# Patient Record
Sex: Female | Born: 2000 | Race: Black or African American | Hispanic: No | Marital: Single | Smoking: Never smoker
Health system: Southern US, Community
[De-identification: ages and names within clinical notes are randomized; demographics above are authoritative.]

## PROBLEM LIST (undated history)

## (undated) ENCOUNTER — Ambulatory Visit (HOSPITAL_COMMUNITY): Admission: EM | Payer: BC Managed Care – PPO

## (undated) DIAGNOSIS — E559 Vitamin D deficiency, unspecified: Secondary | ICD-10-CM

## (undated) DIAGNOSIS — J45990 Exercise induced bronchospasm: Secondary | ICD-10-CM

## (undated) HISTORY — DX: Vitamin D deficiency, unspecified: E55.9

## (undated) HISTORY — DX: Exercise induced bronchospasm: J45.990

## (undated) HISTORY — PX: NO PAST SURGERIES: SHX2092

---
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
## 2019-02-02 ENCOUNTER — Ambulatory Visit: Payer: BC Managed Care – PPO | Attending: Internal Medicine

## 2019-02-02 DIAGNOSIS — Z20822 Contact with and (suspected) exposure to covid-19: Secondary | ICD-10-CM

## 2019-02-05 LAB — NOVEL CORONAVIRUS, NAA: SARS-CoV-2, NAA: DETECTED — AB

## 2019-02-15 ENCOUNTER — Ambulatory Visit: Payer: BC Managed Care – PPO | Attending: Internal Medicine

## 2019-02-15 DIAGNOSIS — Z20822 Contact with and (suspected) exposure to covid-19: Secondary | ICD-10-CM

## 2019-02-16 LAB — NOVEL CORONAVIRUS, NAA: SARS-CoV-2, NAA: NOT DETECTED

## 2019-07-25 ENCOUNTER — Telehealth: Payer: Self-pay | Admitting: Family Medicine

## 2019-07-25 NOTE — Telephone Encounter (Signed)
Ok to put in any 30 min time slot available.

## 2019-07-25 NOTE — Telephone Encounter (Signed)
The patient is needing to reschedule her physical. The next available physical that I seen is in October.   Can we work this patient in sooner for her physical? She needs a Wednesday or Friday between 8-9:30  Please advise

## 2019-07-26 NOTE — Telephone Encounter (Signed)
Spoke with the pt and scheduled an appt for 9/1.

## 2019-07-28 ENCOUNTER — Encounter: Payer: Self-pay | Admitting: Family Medicine

## 2019-09-05 ENCOUNTER — Telehealth: Payer: Self-pay | Admitting: Family Medicine

## 2019-09-05 NOTE — Telephone Encounter (Signed)
Pt is calling to see if she can get her labs for her CPE done before the September 27, 2019 appointment.

## 2019-09-06 ENCOUNTER — Other Ambulatory Visit: Payer: Self-pay | Admitting: Family Medicine

## 2019-09-06 DIAGNOSIS — Z131 Encounter for screening for diabetes mellitus: Secondary | ICD-10-CM

## 2019-09-06 DIAGNOSIS — D72810 Lymphocytopenia: Secondary | ICD-10-CM

## 2019-09-06 DIAGNOSIS — E559 Vitamin D deficiency, unspecified: Secondary | ICD-10-CM

## 2019-09-06 DIAGNOSIS — Z13220 Encounter for screening for lipoid disorders: Secondary | ICD-10-CM

## 2019-09-06 NOTE — Telephone Encounter (Signed)
Left a detailed message at the pts cell number to call back for a fasting lab appt as below. 

## 2019-09-06 NOTE — Telephone Encounter (Signed)
Labs ordered.  Please help set up lab visit appointment.

## 2019-09-08 ENCOUNTER — Telehealth: Payer: Self-pay | Admitting: Family Medicine

## 2019-09-08 NOTE — Telephone Encounter (Signed)
Pt is coming in 08/16 for labs. She is requesting her iron be checked at the visit.  Please advise

## 2019-09-11 ENCOUNTER — Other Ambulatory Visit (INDEPENDENT_AMBULATORY_CARE_PROVIDER_SITE_OTHER): Payer: Self-pay

## 2019-09-11 ENCOUNTER — Other Ambulatory Visit: Payer: Self-pay | Admitting: Family Medicine

## 2019-09-11 ENCOUNTER — Other Ambulatory Visit: Payer: Self-pay

## 2019-09-11 DIAGNOSIS — Z13220 Encounter for screening for lipoid disorders: Secondary | ICD-10-CM

## 2019-09-11 DIAGNOSIS — Z131 Encounter for screening for diabetes mellitus: Secondary | ICD-10-CM

## 2019-09-11 DIAGNOSIS — R5383 Other fatigue: Secondary | ICD-10-CM

## 2019-09-11 DIAGNOSIS — E559 Vitamin D deficiency, unspecified: Secondary | ICD-10-CM

## 2019-09-11 DIAGNOSIS — D72810 Lymphocytopenia: Secondary | ICD-10-CM

## 2019-09-11 LAB — COMPREHENSIVE METABOLIC PANEL
AG Ratio: 1.8 (calc) (ref 1.0–2.5)
ALT: 9 U/L (ref 6–29)
AST: 17 U/L (ref 10–30)
Albumin: 4.6 g/dL (ref 3.6–5.1)
Alkaline phosphatase (APISO): 48 U/L (ref 31–125)
BUN: 19 mg/dL (ref 7–25)
CO2: 25 mmol/L (ref 20–32)
Calcium: 9.3 mg/dL (ref 8.6–10.2)
Chloride: 104 mmol/L (ref 98–110)
Creat: 0.79 mg/dL (ref 0.50–1.10)
Globulin: 2.6 g/dL (calc) (ref 1.9–3.7)
Glucose, Bld: 73 mg/dL (ref 65–99)
Potassium: 3.8 mmol/L (ref 3.5–5.3)
Sodium: 137 mmol/L (ref 135–146)
Total Bilirubin: 0.7 mg/dL (ref 0.2–1.2)
Total Protein: 7.2 g/dL (ref 6.1–8.1)

## 2019-09-11 LAB — CBC WITH DIFFERENTIAL/PLATELET
Absolute Monocytes: 292 cells/uL (ref 200–950)
Basophils Absolute: 20 cells/uL (ref 0–200)
Basophils Relative: 0.6 %
Eosinophils Absolute: 102 cells/uL (ref 15–500)
Eosinophils Relative: 3 %
HCT: 38.1 % (ref 35.0–45.0)
Hemoglobin: 12.9 g/dL (ref 11.7–15.5)
Lymphs Abs: 1125 cells/uL (ref 850–3900)
MCH: 30.4 pg (ref 27.0–33.0)
MCHC: 33.9 g/dL (ref 32.0–36.0)
MCV: 89.9 fL (ref 80.0–100.0)
MPV: 11.7 fL (ref 7.5–12.5)
Monocytes Relative: 8.6 %
Neutro Abs: 1860 cells/uL (ref 1500–7800)
Neutrophils Relative %: 54.7 %
Platelets: 167 10*3/uL (ref 140–400)
RBC: 4.24 10*6/uL (ref 3.80–5.10)
RDW: 12 % (ref 11.0–15.0)
Total Lymphocyte: 33.1 %
WBC: 3.4 10*3/uL — ABNORMAL LOW (ref 3.8–10.8)

## 2019-09-11 LAB — IRON,TIBC AND FERRITIN PANEL
%SAT: 25 % (calc) (ref 16–45)
Ferritin: 38 ng/mL (ref 16–154)
Iron: 95 ug/dL (ref 40–190)
TIBC: 373 mcg/dL (calc) (ref 250–450)

## 2019-09-11 LAB — LIPID PANEL
Cholesterol: 126 mg/dL (ref <200)
HDL: 55 mg/dL (ref >=50)
LDL Cholesterol (Calc): 63 mg/dL (calc)
Non-HDL Cholesterol (Calc): 71 mg/dL (calc) (ref <130)
Total CHOL/HDL Ratio: 2.3 (calc) (ref <5.0)
Triglycerides: 27 mg/dL (ref <150)

## 2019-09-11 LAB — VITAMIN D 25 HYDROXY (VIT D DEFICIENCY, FRACTURES): Vit D, 25-Hydroxy: 27 ng/mL — ABNORMAL LOW (ref 30–100)

## 2019-09-11 NOTE — Telephone Encounter (Signed)
Iron panel added to bloodwork already ordered. Please make sure lab aware of orders from two different days (8/11 and 8/16) or that patient makes her aware.

## 2019-09-11 NOTE — Telephone Encounter (Signed)
Left a detailed message at the pts cell number to let the lab tech know there are labs to be done from 2 separate dates.

## 2019-09-27 ENCOUNTER — Ambulatory Visit: Payer: Self-pay | Admitting: Family Medicine

## 2019-11-20 ENCOUNTER — Telehealth: Payer: Self-pay | Admitting: *Deleted

## 2019-11-20 NOTE — Telephone Encounter (Signed)
Left a detailed message at the pts cell number to return a call with the specific name of the "vaccine" the patient is referring to.

## 2019-11-20 NOTE — Telephone Encounter (Signed)
Patient called wanting to know if she needs her physical before getting the vaccine. Patient is scheduled for a CPE 12/20/19. I made patient aware. I asked patient if she would like to schedule her vaccine and patient requested a call back from the nurse regarding this first.

## 2019-11-21 NOTE — Telephone Encounter (Signed)
Patient called back and stated she saw a sign that stated you should check with your healthcare provider before receiving the Covid vaccine.  I advised the pt we do not require a visit prior to receiving a Covid vaccine as it is up the pt to receive the vaccine or not.

## 2019-12-20 ENCOUNTER — Other Ambulatory Visit: Payer: Self-pay

## 2019-12-20 ENCOUNTER — Ambulatory Visit (INDEPENDENT_AMBULATORY_CARE_PROVIDER_SITE_OTHER): Payer: BC Managed Care – PPO | Admitting: Family Medicine

## 2019-12-20 ENCOUNTER — Encounter: Payer: Self-pay | Admitting: Family Medicine

## 2019-12-20 VITALS — BP 92/50 | HR 96 | Temp 98.1°F | Ht 63.5 in | Wt 133.4 lb

## 2019-12-20 DIAGNOSIS — Z0000 Encounter for general adult medical examination without abnormal findings: Secondary | ICD-10-CM

## 2019-12-20 NOTE — Progress Notes (Signed)
Dauna Awurabena Delali Som-Pimpong DOB: 2000/07/24 Encounter date: 12/20/2019  This is a 19 y.o. female who presents for complete physical   History of present illness/Additional concerns: Last visit was over a year ago.  At that time we had been regularly discussing some stress and anxiety.  She was focusing on school at that time.  Things are going well; grades are good, more at peace. She is doing nursing as well as premed requirements.   Sleeps well.   Exercises on fairly regular basis. Eating healthy.    Past Medical History:  Diagnosis Date  . Exercise-induced asthma    Past Surgical History:  Procedure Laterality Date  . NO PAST SURGERIES     Allergies  Allergen Reactions  . Almond (Diagnostic)   . Apple   . Other Swelling    Jackfruit- lip swelling; dogs-hives     Current Meds  Medication Sig  . Multiple Vitamins-Minerals (MULTIVITAMIN ADULT PO) Take by mouth.  Marland Kitchen VITAMIN D, CHOLECALCIFEROL, PO Take by mouth daily.   Social History   Tobacco Use  . Smoking status: Never Smoker  . Smokeless tobacco: Never Used  Substance Use Topics  . Alcohol use: Never   Family History  Problem Relation Age of Onset  . Diabetes Mother   . Hypertension Mother   . Diabetes Father   . Alzheimer's disease Maternal Grandmother 90  . Edema Paternal Grandmother   . Dementia Maternal Aunt 80  . Dementia Maternal Aunt 70     Review of Systems  Constitutional: Negative for activity change, appetite change, chills, fatigue, fever and unexpected weight change.  HENT: Negative for congestion, ear pain, hearing loss, sinus pressure, sinus pain, sore throat and trouble swallowing.   Eyes: Negative for pain and visual disturbance.  Respiratory: Negative for cough, chest tightness, shortness of breath and wheezing.   Cardiovascular: Negative for chest pain, palpitations and leg swelling.  Gastrointestinal: Negative for abdominal pain, blood in stool, constipation, diarrhea, nausea  and vomiting.  Genitourinary: Negative for difficulty urinating and menstrual problem.  Musculoskeletal: Negative for arthralgias and back pain.  Skin: Negative for rash.  Neurological: Negative for dizziness, weakness, numbness and headaches.  Hematological: Negative for adenopathy. Does not bruise/bleed easily.  Psychiatric/Behavioral: Negative for sleep disturbance and suicidal ideas. The patient is not nervous/anxious.     CBC:  Lab Results  Component Value Date   WBC 3.4 (L) 09/11/2019   HGB 12.9 09/11/2019   HCT 38.1 09/11/2019   MCH 30.4 09/11/2019   MCHC 33.9 09/11/2019   RDW 12.0 09/11/2019   PLT 167 09/11/2019   MPV 11.7 09/11/2019   CMP: Lab Results  Component Value Date   NA 137 09/11/2019   K 3.8 09/11/2019   CL 104 09/11/2019   CO2 25 09/11/2019   GLUCOSE 73 09/11/2019   BUN 19 09/11/2019   CREATININE 0.79 09/11/2019   CALCIUM 9.3 09/11/2019   PROT 7.2 09/11/2019   BILITOT 0.7 09/11/2019   ALKPHOS 50 08/22/2018   ALT 9 09/11/2019   AST 17 09/11/2019   LIPID: Lab Results  Component Value Date   CHOL 126 09/11/2019   TRIG 27 09/11/2019   HDL 55 09/11/2019   LDLCALC 63 09/11/2019    Objective:  BP (!) 92/50 (BP Location: Right Arm, Patient Position: Sitting, Cuff Size: Normal)   Pulse 96   Temp 98.1 F (36.7 C) (Oral)   Ht 5' 3.5" (1.613 m)   Wt 133 lb 6.4 oz (60.5 kg)   BMI  23.26 kg/m   Weight: 133 lb 6.4 oz (60.5 kg)   BP Readings from Last 3 Encounters:  12/20/19 (!) 92/50  09/14/18 97/67  08/22/18 (!) 90/50   Wt Readings from Last 3 Encounters:  12/20/19 133 lb 6.4 oz (60.5 kg)  10/17/18 126 lb (57.2 kg) (47 %, Z= -0.07)*  09/14/18 120 lb 12.8 oz (54.8 kg) (37 %, Z= -0.33)*   * Growth percentiles are based on CDC (Girls, 2-20 Years) data.    Physical Exam Constitutional:      General: She is not in acute distress.    Appearance: She is well-developed.  HENT:     Head: Normocephalic and atraumatic.     Right Ear: External ear  normal.     Left Ear: External ear normal.     Mouth/Throat:     Pharynx: No oropharyngeal exudate.  Eyes:     Conjunctiva/sclera: Conjunctivae normal.     Pupils: Pupils are equal, round, and reactive to light.  Neck:     Thyroid: No thyromegaly.  Cardiovascular:     Rate and Rhythm: Normal rate and regular rhythm.     Heart sounds: Normal heart sounds. No murmur heard.  No friction rub. No gallop.   Pulmonary:     Effort: Pulmonary effort is normal.     Breath sounds: Normal breath sounds.  Abdominal:     General: Bowel sounds are normal. There is no distension.     Palpations: Abdomen is soft. There is no mass.     Tenderness: There is no abdominal tenderness. There is no guarding.     Hernia: No hernia is present.  Musculoskeletal:        General: No tenderness or deformity. Normal range of motion.     Cervical back: Normal range of motion and neck supple.  Lymphadenopathy:     Cervical: No cervical adenopathy.  Skin:    General: Skin is warm and dry.     Findings: No rash.  Neurological:     Mental Status: She is alert and oriented to person, place, and time.     Deep Tendon Reflexes: Reflexes normal.     Reflex Scores:      Tricep reflexes are 2+ on the right side and 2+ on the left side.      Bicep reflexes are 2+ on the right side and 2+ on the left side.      Brachioradialis reflexes are 2+ on the right side and 2+ on the left side.      Patellar reflexes are 2+ on the right side and 2+ on the left side. Psychiatric:        Speech: Speech normal.        Behavior: Behavior normal.        Thought Content: Thought content normal.     Assessment/Plan: Health Maintenance Due  Topic Date Due  . Hepatitis C Screening  Never done   Health Maintenance reviewed.  1. Preventative health care Delali is doing well. She is managing stress well. She is doing well in school. We discussed keeping up with healthy lifestyle behaviors like exercise to help with stress  management.   Return in about 1 year (around 12/19/2020) for physical exam.  Theodis Shove, MD

## 2020-05-31 ENCOUNTER — Other Ambulatory Visit: Payer: Self-pay

## 2020-06-03 ENCOUNTER — Ambulatory Visit (INDEPENDENT_AMBULATORY_CARE_PROVIDER_SITE_OTHER): Payer: Self-pay | Admitting: Family Medicine

## 2020-06-03 ENCOUNTER — Encounter: Payer: Self-pay | Admitting: Family Medicine

## 2020-06-03 ENCOUNTER — Other Ambulatory Visit: Payer: Self-pay

## 2020-06-03 VITALS — BP 120/50 | HR 60 | Temp 98.1°F | Ht 63.5 in | Wt 128.7 lb

## 2020-06-03 DIAGNOSIS — K5900 Constipation, unspecified: Secondary | ICD-10-CM

## 2020-06-03 DIAGNOSIS — D72819 Decreased white blood cell count, unspecified: Secondary | ICD-10-CM | POA: Diagnosis not present

## 2020-06-03 DIAGNOSIS — E559 Vitamin D deficiency, unspecified: Secondary | ICD-10-CM

## 2020-06-03 DIAGNOSIS — N920 Excessive and frequent menstruation with regular cycle: Secondary | ICD-10-CM

## 2020-06-03 DIAGNOSIS — E611 Iron deficiency: Secondary | ICD-10-CM | POA: Diagnosis not present

## 2020-06-03 DIAGNOSIS — Z0000 Encounter for general adult medical examination without abnormal findings: Secondary | ICD-10-CM

## 2020-06-03 LAB — FERRITIN: Ferritin: 31.4 ng/mL (ref 10.0–291.0)

## 2020-06-03 LAB — TSH: TSH: 1.09 u[IU]/mL (ref 0.35–4.50)

## 2020-06-03 NOTE — Patient Instructions (Addendum)
*  constipation - work on regular water intake. I think with regular prune juice. Fiber supplementation is an option - benefiber or metamucil or citrucel*. Stool softeners are also ok - colace. Finally, miralax is an option. You could consider using something like a miralax to pull in water and help with regular bowel movements. AVOID laxatives.   *if you want something for nausea with periods let me know. Work on getting ibuprofen 72 hours prior to your period.

## 2020-06-03 NOTE — Progress Notes (Signed)
Roselynne Awurabena Delali Som-Pimpong DOB: May 25, 2000 Encounter date: 06/03/2020  This is a 20 y.o. female who presents for complete physical   History of present illness/Additional concerns: She is about to start nursing school. School at [employer]. Has papers she needs completed.   She is having difficulty with bowel movements. When good - will have BM every morning. When not good then she will not go for a couple of days. Worse when she isn't good with water intake. Prune juice helps with regularity if needed. Has to strain or sit on toilet for a long time to have bm.   Periods also a concern - LMP was Friday. First and second day are really bad - throws up for those days. She is working on cutting out sugar, working on exercise as well to see if it will help. She has always had bad periods. Hasn't been on birth control before; doesn't want to bo on it. Regular every 4 weeks. Gets bad cramping and tries to take it before period. Mother had similarly bad periods. Hot drinks, exercise seems to help. Bowel movement also seems to help with cramping.   Has been controlling anxiety well with meditation, prayer. Feels more aware of what she is feeling and how to manage this.    Past Medical History:  Diagnosis Date  . Exercise-induced asthma    Past Surgical History:  Procedure Laterality Date  . NO PAST SURGERIES     Allergies  Allergen Reactions  . Almond (Diagnostic)   . Apple   . Other Swelling    Jackfruit- lip swelling; dogs-hives     No outpatient medications have been marked as taking for the 06/03/20 encounter (Office Visit) with Wynn Banker, MD.   Social History   Tobacco Use  . Smoking status: Never Smoker  . Smokeless tobacco: Never Used  Substance Use Topics  . Alcohol use: Never   Family History  Problem Relation Age of Onset  . Diabetes Mother   . Hypertension Mother   . Diabetes Father   . Alzheimer's disease Maternal Grandmother 90  . Edema Paternal Grandmother    . Dementia Maternal Aunt 80  . Dementia Maternal Aunt 70     Review of Systems  Constitutional: Negative for activity change, appetite change, chills, fatigue, fever and unexpected weight change.  HENT: Negative for congestion, ear pain, hearing loss, sinus pressure, sinus pain, sore throat and trouble swallowing.   Eyes: Negative for pain and visual disturbance.  Respiratory: Negative for cough, chest tightness, shortness of breath and wheezing.   Cardiovascular: Negative for chest pain, palpitations and leg swelling.  Gastrointestinal: Negative for abdominal pain, blood in stool, constipation, diarrhea, nausea and vomiting.  Genitourinary: Positive for menstrual problem. Negative for difficulty urinating.  Musculoskeletal: Negative for arthralgias and back pain.  Skin: Negative for rash.  Neurological: Negative for dizziness, weakness, numbness and headaches.  Hematological: Negative for adenopathy. Does not bruise/bleed easily.  Psychiatric/Behavioral: Negative for sleep disturbance and suicidal ideas. The patient is not nervous/anxious.     CBC:  Lab Results  Component Value Date   WBC 3.4 (L) 09/11/2019   HGB 12.9 09/11/2019   HCT 38.1 09/11/2019   MCH 30.4 09/11/2019   MCHC 33.9 09/11/2019   RDW 12.0 09/11/2019   PLT 167 09/11/2019   MPV 11.7 09/11/2019   CMP: Lab Results  Component Value Date   NA 137 09/11/2019   K 3.8 09/11/2019   CL 104 09/11/2019   CO2 25 09/11/2019  GLUCOSE 73 09/11/2019   BUN 19 09/11/2019   CREATININE 0.79 09/11/2019   CALCIUM 9.3 09/11/2019   PROT 7.2 09/11/2019   BILITOT 0.7 09/11/2019   ALKPHOS 50 08/22/2018   ALT 9 09/11/2019   AST 17 09/11/2019   LIPID: Lab Results  Component Value Date   CHOL 126 09/11/2019   TRIG 27 09/11/2019   HDL 55 09/11/2019   LDLCALC 63 09/11/2019    Objective:  BP (!) 120/50 (BP Location: Right Arm, Patient Position: Sitting, Cuff Size: Normal)   Pulse 60   Temp 98.1 F (36.7 C) (Oral)    Ht 5' 3.5" (1.613 m)   Wt 128 lb 11.2 oz (58.4 kg)   LMP 05/31/2020 (Exact Date)   SpO2 99%   BMI 22.44 kg/m   Weight: 128 lb 11.2 oz (58.4 kg)   BP Readings from Last 3 Encounters:  06/03/20 (!) 120/50  12/20/19 (!) 92/50  09/14/18 97/67   Wt Readings from Last 3 Encounters:  06/03/20 128 lb 11.2 oz (58.4 kg)  12/20/19 133 lb 6.4 oz (60.5 kg)  10/17/18 126 lb (57.2 kg) (47 %, Z= -0.07)*   * Growth percentiles are based on CDC (Girls, 2-20 Years) data.    Physical Exam Constitutional:      General: She is not in acute distress.    Appearance: She is well-developed.  HENT:     Head: Normocephalic and atraumatic.     Right Ear: External ear normal.     Left Ear: External ear normal.     Mouth/Throat:     Pharynx: No oropharyngeal exudate.  Eyes:     Conjunctiva/sclera: Conjunctivae normal.     Pupils: Pupils are equal, round, and reactive to light.  Neck:     Thyroid: No thyromegaly.  Cardiovascular:     Rate and Rhythm: Normal rate and regular rhythm.     Heart sounds: Normal heart sounds. No murmur heard. No friction rub. No gallop.   Pulmonary:     Effort: Pulmonary effort is normal.     Breath sounds: Normal breath sounds.  Abdominal:     General: Bowel sounds are normal. There is no distension.     Palpations: Abdomen is soft. There is no mass.     Tenderness: There is no abdominal tenderness. There is no guarding.     Hernia: No hernia is present.  Musculoskeletal:        General: No tenderness or deformity. Normal range of motion.     Cervical back: Normal range of motion and neck supple.  Lymphadenopathy:     Cervical: No cervical adenopathy.  Skin:    General: Skin is warm and dry.     Findings: No rash.  Neurological:     Mental Status: She is alert and oriented to person, place, and time.     Deep Tendon Reflexes: Reflexes normal.     Reflex Scores:      Tricep reflexes are 2+ on the right side and 2+ on the left side.      Bicep reflexes are 2+ on  the right side and 2+ on the left side.      Brachioradialis reflexes are 2+ on the right side and 2+ on the left side.      Patellar reflexes are 2+ on the right side and 2+ on the left side. Psychiatric:        Speech: Speech normal.        Behavior: Behavior normal.  Thought Content: Thought content normal.     Assessment/Plan: Health Maintenance Due  Topic Date Due  . Hepatitis C Screening  Never done  . PAP-Cervical Cytology Screening  04/28/2020  . PAP SMEAR-Modifier  04/28/2020   Health Maintenance reviewed.  1. Preventative health care We discussed keeping up with stress prevention including having regular exercise routine.  She has been good about being more in tune with her stress levels and managing this stress overall.  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 - QuantiFERON-TB Gold Plus; Future  2. Menorrhagia with regular cycle She does not wish to be on any birth control.  She mostly worries about side effects or future issues with fertility.  We discussed different period control options including contraceptive pills that may be helpful for lightening and shortening her.  As well as decreasing cramps.  She would like to try to work on exercise and continue with ibuprofen for now.  We discussed premedicating (which she has been trying to do) to help decrease cramps.  I would be okay with giving her some Zofran to use during that day of cramping so that she is able to keep down ibuprofen to help with discomfort.  She does not wish to try this at present though. - TSH; Future  3. Vitamin D deficiency She was taking supplement regularly.  Levels were normal on last check.  4. Leukopenia, unspecified type Checking blood work today.  5. Iron deficiency - Ferritin; Future  6. Constipation, unspecified constipation type She is going to work on water intake.    Return in about 1 year (around 06/03/2021) for physical exam.  Theodis Shove,  MD

## 2020-06-05 LAB — QUANTIFERON-TB GOLD PLUS
Mitogen-NIL: >10 IU/mL
NIL: 0.02 IU/mL
QuantiFERON-TB Gold Plus: NEGATIVE
TB1-NIL: 0 IU/mL
TB2-NIL: 0 IU/mL

## 2020-06-26 ENCOUNTER — Telehealth: Payer: Self-pay | Admitting: Family Medicine

## 2020-06-26 NOTE — Telephone Encounter (Signed)
Spoke with the pt for more information as to what she was referring to as below.  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

## 2020-06-26 NOTE — Telephone Encounter (Signed)
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  Pt would like to have a call back to let her know.

## 2021-07-30 ENCOUNTER — Other Ambulatory Visit: Payer: Self-pay | Admitting: Family

## 2021-07-30 ENCOUNTER — Telehealth: Payer: Self-pay | Admitting: Family Medicine

## 2021-07-30 NOTE — Telephone Encounter (Signed)
Pt requesting a referral to a gastroenterologist for constipation

## 2021-07-31 ENCOUNTER — Other Ambulatory Visit: Payer: Self-pay | Admitting: Family

## 2021-07-31 DIAGNOSIS — K5900 Constipation, unspecified: Secondary | ICD-10-CM

## 2021-07-31 MED ORDER — POLYETHYLENE GLYCOL 3350 17 GM/SCOOP PO POWD: 17.0000 g | Freq: Two times a day (BID) | ORAL | 1 refills | Status: DC | PRN

## 2021-07-31 NOTE — Telephone Encounter (Signed)
Spoke with the patient and she stated she has tried prune juice, magnesium and smoothie which helped some.  Patient states she mentioned this at her last visit over 1 yr ago, often has to sit on the toliet for 2 hours, has had constipation for over 1 year and would like to know why this keeps happening to her?  Message sent to [facility].

## 2021-08-01 NOTE — Telephone Encounter (Signed)
Patient informed of the message below.

## 2021-08-08 ENCOUNTER — Ambulatory Visit: Payer: BC Managed Care – PPO | Admitting: Family

## 2021-08-08 ENCOUNTER — Encounter: Payer: Self-pay | Admitting: Family

## 2021-08-08 VITALS — BP 80/50 | HR 66 | Temp 98.4°F | Ht 63.5 in | Wt 140.8 lb

## 2021-08-08 DIAGNOSIS — E559 Vitamin D deficiency, unspecified: Secondary | ICD-10-CM | POA: Diagnosis not present

## 2021-08-08 DIAGNOSIS — R5383 Other fatigue: Secondary | ICD-10-CM | POA: Diagnosis not present

## 2021-08-08 DIAGNOSIS — F439 Reaction to severe stress, unspecified: Secondary | ICD-10-CM

## 2021-08-08 DIAGNOSIS — L309 Dermatitis, unspecified: Secondary | ICD-10-CM | POA: Diagnosis not present

## 2021-08-08 DIAGNOSIS — F430 Acute stress reaction: Secondary | ICD-10-CM | POA: Diagnosis not present

## 2021-08-08 LAB — COMPREHENSIVE METABOLIC PANEL
ALT: 7 U/L (ref 0–35)
AST: 15 U/L (ref 0–37)
Albumin: 4.9 g/dL (ref 3.5–5.2)
Alkaline Phosphatase: 47 U/L (ref 39–117)
BUN: 10 mg/dL (ref 6–23)
CO2: 28 mEq/L (ref 19–32)
Calcium: 9.7 mg/dL (ref 8.4–10.5)
Chloride: 104 mEq/L (ref 96–112)
Creatinine, Ser: 0.82 mg/dL (ref 0.40–1.20)
GFR: 101.63 mL/min (ref >60.00)
Glucose, Bld: 78 mg/dL (ref 70–99)
Potassium: 4 mEq/L (ref 3.5–5.1)
Sodium: 138 mEq/L (ref 135–145)
Total Bilirubin: 0.4 mg/dL (ref 0.2–1.2)
Total Protein: 7.5 g/dL (ref 6.0–8.3)

## 2021-08-08 LAB — VITAMIN B12: Vitamin B-12: 588 pg/mL (ref 211–911)

## 2021-08-08 LAB — CBC WITH DIFFERENTIAL/PLATELET
Basophils Absolute: 0 10*3/uL (ref 0.0–0.1)
Basophils Relative: 0.7 % (ref 0.0–3.0)
Eosinophils Absolute: 0.1 10*3/uL (ref 0.0–0.7)
Eosinophils Relative: 2.3 % (ref 0.0–5.0)
HCT: 37.6 % (ref 36.0–46.0)
Hemoglobin: 12.6 g/dL (ref 12.0–15.0)
Lymphocytes Relative: 40.2 % (ref 12.0–46.0)
Lymphs Abs: 1.3 10*3/uL (ref 0.7–4.0)
MCHC: 33.5 g/dL (ref 30.0–36.0)
MCV: 88.9 fl (ref 78.0–100.0)
Monocytes Absolute: 0.3 10*3/uL (ref 0.1–1.0)
Monocytes Relative: 11.1 % (ref 3.0–12.0)
Neutro Abs: 1.4 10*3/uL (ref 1.4–7.7)
Neutrophils Relative %: 45.7 % (ref 43.0–77.0)
Platelets: 176 10*3/uL (ref 150.0–400.0)
RBC: 4.23 Mil/uL (ref 3.87–5.11)
RDW: 12.9 % (ref 11.5–15.5)
WBC: 3.1 10*3/uL — ABNORMAL LOW (ref 4.0–10.5)

## 2021-08-08 LAB — VITAMIN D 25 HYDROXY (VIT D DEFICIENCY, FRACTURES): VITD: 25.42 ng/mL — ABNORMAL LOW (ref 30.00–100.00)

## 2021-08-08 NOTE — Progress Notes (Signed)
Acute Office Visit  Subjective:     Patient ID: Lindsey Sandoval, female    DOB: 01-18-2001, 21 y.o.   MRN: [id number]  Chief Complaint  Patient presents with  . Fatigue    X2 years    HPI Patient is in today with c/o feeling fatigued for the last 2 years. She reports the symptoms coming and going. Worse during the academic year. She is a Teacher, early years/pre at [employer]. Reports feeling anxious at times and having chest flutters. She reports she goes for a run or goes to sleep and it helps. Reports having a menstrual cycle that is typically 5-7 days and heavy. Has been worked up for fatigue in the past and has been found to be vitamin d deficient but other tests normal   Review of Systems  Constitutional:  Positive for malaise/fatigue. Negative for fever.  Psychiatric/Behavioral:  Negative for depression and suicidal ideas. The patient is nervous/anxious.   All other systems reviewed and are negative.      Objective:    BP (!) 80/50 (BP Location: Left Arm, Patient Position: Sitting, Cuff Size: Normal)   Pulse 66   Temp 98.4 F (36.9 C) (Oral)   Ht 5' 3.5" (1.613 m)   Wt 140 lb 12.8 oz (63.9 kg)   LMP 07/21/2021 (Exact Date)   SpO2 98%   BMI 24.55 kg/m    Physical Exam Vitals and nursing note reviewed.  Constitutional:      Appearance: Normal appearance. She is normal weight.  HENT:     Right Ear: Tympanic membrane and ear canal normal.     Left Ear: Tympanic membrane and ear canal normal.  Cardiovascular:     Rate and Rhythm: Normal rate and regular rhythm.  Pulmonary:     Effort: Pulmonary effort is normal.     Breath sounds: Normal breath sounds.  Abdominal:     General: Abdomen is flat.     Palpations: Abdomen is soft.  Musculoskeletal:        General: Normal range of motion.     Cervical back: Normal range of motion and neck supple.  Skin:    General: Skin is warm and dry.  Neurological:     General: No focal deficit present.     Mental  Status: She is alert and oriented to person, place, and time.  Psychiatric:        Mood and Affect: Mood normal.        Behavior: Behavior normal.   No results found for any visits on 08/08/21.      Assessment & Plan:   Problem List Items Addressed This Visit   None Visit Diagnoses     Fatigue, unspecified type    -  Primary   Relevant Orders   Vitamin B12   Vitamin D, 25-hydroxy   ANA   Thyroid Panel With TSH   CBC with Differential/Platelets   CMP   Dermatitis       Relevant Orders   ANA   CMP   Stress reaction       Relevant Orders   CMP   Vitamin D deficiency       Relevant Orders   Vitamin D, 25-hydroxy   CMP      Call the office if symptoms worsen or persist. Will follow-up pending labs and sooner as needed.   No orders of the defined types were placed in this encounter.   No follow-ups on file.  Geo Slone B  Hyman Hopes, FNP

## 2021-08-10 ENCOUNTER — Other Ambulatory Visit: Payer: Self-pay | Admitting: Family

## 2021-08-10 MED ORDER — VITAMIN D (ERGOCALCIFEROL) 1.25 MG (50000 UNIT) PO CAPS: 50000.0000 [IU] | ORAL_CAPSULE | ORAL | 0 refills | Status: DC

## 2021-08-11 ENCOUNTER — Other Ambulatory Visit: Payer: Self-pay | Admitting: Family

## 2021-08-11 DIAGNOSIS — R768 Other specified abnormal immunological findings in serum: Secondary | ICD-10-CM

## 2021-08-11 LAB — THYROID PANEL WITH TSH
Free Thyroxine Index: 2.5 (ref 1.4–3.8)
T3 Uptake: 31 % (ref 22–35)
T4, Total: 8.2 ug/dL (ref 5.1–11.9)
TSH: 1.69 mIU/L

## 2021-08-11 LAB — ANTI-NUCLEAR AB-TITER (ANA TITER): ANA Titer 1: 1:80 {titer} — ABNORMAL HIGH

## 2021-08-11 LAB — ANA: Anti Nuclear Antibody (ANA): POSITIVE — AB

## 2021-08-12 ENCOUNTER — Telehealth: Payer: Self-pay | Admitting: Family Medicine

## 2021-08-12 NOTE — Telephone Encounter (Signed)
Noted  

## 2021-08-12 NOTE — Telephone Encounter (Signed)
--  Caller states she has had diarrhea since she woke up and vomited a couple of times, and one time she noticed blood in her vomit. She has urinated in the last 8 hours. She use to see Dr. Hassan Rowan but that doctor left so she is scheduled to see Dr. Kerin Ransom in August. Sx started this am. Has been seen for fatigue, ANA positive , awaiting rheumatologist.  08/12/2021 11:50:40 AM Go to ED Now Elesa Hacker, RN, Nash Dimmer  Referrals [facility] - ED  08/12/21 1416 - LVM to check on patient & see if she went to where she was disposed to go.

## 2021-08-12 NOTE — Telephone Encounter (Signed)
Pt called stating she was having terrible diarrhea x 2-3 days and just threw up blood. Pt was transferred to Triage Nurse.

## 2021-08-20 ENCOUNTER — Telehealth: Payer: Self-pay | Admitting: Family Medicine

## 2021-08-20 NOTE — Telephone Encounter (Signed)
Pt called to ask if she could have a Quantiferon test for school? She is asking if she can come in before her 08/26/21 TOC appt?  Please advise.  [phone]

## 2021-08-20 NOTE — Telephone Encounter (Signed)
Ok to order the test for her

## 2021-08-20 NOTE — Telephone Encounter (Signed)
Spoke with the patient and offered to schedule a lab appt as below.  Patient stated she would prefer to have the test done during the visit on 8/1.

## 2021-08-26 ENCOUNTER — Encounter: Payer: Self-pay | Admitting: Family Medicine

## 2021-08-26 ENCOUNTER — Ambulatory Visit: Payer: BC Managed Care – PPO | Admitting: Family Medicine

## 2021-08-26 VITALS — BP 100/58 | HR 80 | Temp 98.3°F | Ht 63.5 in | Wt 136.1 lb

## 2021-08-26 DIAGNOSIS — K5909 Other constipation: Secondary | ICD-10-CM | POA: Diagnosis not present

## 2021-08-26 DIAGNOSIS — R112 Nausea with vomiting, unspecified: Secondary | ICD-10-CM | POA: Diagnosis not present

## 2021-08-26 DIAGNOSIS — N946 Dysmenorrhea, unspecified: Secondary | ICD-10-CM | POA: Insufficient documentation

## 2021-08-26 MED ORDER — ONDANSETRON HCL 4 MG PO TABS: 4.0000 mg | ORAL_TABLET | Freq: Three times a day (TID) | ORAL | 0 refills | Status: DC | PRN

## 2021-08-26 NOTE — Progress Notes (Signed)
   Established Patient Office Visit  Subjective   Patient ID: Lindsey Sandoval, female    DOB: [birth year]  Age: 20 y.o. MRN: [id number]  Chief Complaint  Patient presents with  . Establish Care    Patient is here for follow up on her fatigue. Patient states that she used to have a lot of energy but in the last few weeks her fatigue has been getting. New symptoms of 2 episodes of vomiting and episodes of diarrhea that was just happening in the morning. She had a series of labs drawn on 7/14 and her autoimmune labs were positive so referral was made to rheumatology.she reports she was having some muscle pain in her chest which has resolved. No dizziness, no fever/chills, no sick contacts. States she was out of the country in March, went to [place].   Has a rheumatology appointment on August 8th to look at the positive ANA. Her WBC count was reviewed and is also on the low side.    {History (Optional):23778}  Review of Systems  All other systems reviewed and are negative.     Objective:     BP (!) 100/58 (BP Location: Left Arm, Patient Position: Sitting, Cuff Size: Normal)   Pulse 80   Temp 98.3 F (36.8 C) (Oral)   Ht 5' 3.5" (1.613 m)   Wt 136 lb 1.6 oz (61.7 kg)   LMP 08/17/2021 (Exact Date)   SpO2 96%   BMI 23.73 kg/m  {Vitals History (Optional):23777}  Physical Exam Vitals reviewed.     No results found for any visits on 08/26/21.  {Labs (Optional):23779}  The ASCVD Risk score (Arnett DK, et al., 2019) failed to calculate for the following reasons:   The 2019 ASCVD risk score is only valid for ages 79 to 46    Assessment & Plan:   Problem List Items Addressed This Visit       Digestive   Nausea & vomiting   Chronic constipation (Chronic)    No follow-ups on file.    Karie Georges, MD

## 2021-08-26 NOTE — Patient Instructions (Signed)
Stay as hydrated as possible.   Use the zofran as needed for nausea/ vomiting.

## 2021-08-27 NOTE — Assessment & Plan Note (Signed)
Patient reported, I encouraged her to increase hydration but since she is currently having diarrhea and not constipation then will not recommend treatment just yet - she may need additional imaging with X ray or CT abd if her symptoms persist.

## 2021-08-27 NOTE — Assessment & Plan Note (Signed)
Severe with extreme fatigue and abdominal pain during her periods, she is also due for her pap smear - -I will place referral to GYN to discuss her symptoms further and obtain pap smear.

## 2021-08-27 NOTE — Assessment & Plan Note (Addendum)
With diarrhea - persistent and not improved since her last visit 2 weeks ago - Unclear etiology-- I reviewed her labs from 2 weeks ago and she did have a positive ANA and has a rheumatology appointment pending. Will treat symptomatically for now with ondansetron 4 mg as needed for the nausea/vomiting while her workup is still pending. If no caused is revealed with the rheumatology consultation then I will order additional GI studies and possible need for consultation with GI

## 2021-09-02 ENCOUNTER — Telehealth: Payer: Self-pay | Admitting: Family Medicine

## 2021-09-02 ENCOUNTER — Other Ambulatory Visit: Payer: BC Managed Care – PPO

## 2021-09-02 DIAGNOSIS — Z111 Encounter for screening for respiratory tuberculosis: Secondary | ICD-10-CM

## 2021-09-02 NOTE — Telephone Encounter (Signed)
Spoke with the patient and scheduled a lab appt for 8/9.

## 2021-09-02 NOTE — Telephone Encounter (Signed)
Pt requesting Tb quantiferon for school. Had Tdap 06/24/2019, states school requires it yearly.

## 2021-09-02 NOTE — Telephone Encounter (Signed)
Ok to place order for patient.

## 2021-09-03 ENCOUNTER — Encounter: Payer: Self-pay | Admitting: Radiology

## 2021-09-03 ENCOUNTER — Other Ambulatory Visit (INDEPENDENT_AMBULATORY_CARE_PROVIDER_SITE_OTHER): Payer: BC Managed Care – PPO

## 2021-09-03 ENCOUNTER — Ambulatory Visit (INDEPENDENT_AMBULATORY_CARE_PROVIDER_SITE_OTHER): Payer: BC Managed Care – PPO | Admitting: Radiology

## 2021-09-03 ENCOUNTER — Other Ambulatory Visit (HOSPITAL_COMMUNITY)
Admission: RE | Admit: 2021-09-03 | Discharge: 2021-09-03 | Disposition: A | Discharge status: Home / Self Care | Payer: BC Managed Care – PPO | Source: Ambulatory Visit | Attending: Radiology | Admitting: Radiology

## 2021-09-03 VITALS — BP 88/58 | Ht 63.0 in | Wt 139.0 lb

## 2021-09-03 DIAGNOSIS — N946 Dysmenorrhea, unspecified: Secondary | ICD-10-CM | POA: Diagnosis not present

## 2021-09-03 DIAGNOSIS — Z111 Encounter for screening for respiratory tuberculosis: Secondary | ICD-10-CM

## 2021-09-03 DIAGNOSIS — Z01419 Encounter for gynecological examination (general) (routine) without abnormal findings: Secondary | ICD-10-CM | POA: Insufficient documentation

## 2021-09-03 DIAGNOSIS — N920 Excessive and frequent menstruation with regular cycle: Secondary | ICD-10-CM

## 2021-09-03 NOTE — Progress Notes (Signed)
   Nikala Ammie Ferrier Delali Som-Pimpong Jul 13, 2000 [id number]   History:  21 y.o. G0 presents for annual exam as a new patient. She has been struggling with heavy painful periods with associated nausea and diarrhea since menarche at age 34. She uses ibuprofen with some effect. For the past month she has been experiencing fatigue. Positive ANA, currently being worked up by rheumatology.   Gynecologic History Patient's last menstrual period was 08/17/2021 (exact date). Period Cycle (Days): 28 Period Duration (Days): 7 Period Pattern: Regular Menstrual Flow: Moderate Menstrual Control: Maxi pad Dysmenorrhea: (!) Severe Dysmenorrhea Symptoms: Cramping Contraception/Family planning: abstinence Sexually active: no Last Pap: never. Obstetric History OB History  Gravida Para Term Preterm AB Living  0 0 0 0 0 0  SAB IAB Ectopic Multiple Live Births  0 0 0 0 0     The following portions of the patient's history were reviewed and updated as appropriate: allergies, current medications, past family history, past medical history, past social history, past surgical history, and problem list.  Review of Systems Pertinent items noted in HPI and remainder of comprehensive ROS otherwise negative.   Past medical history, past surgical history, family history and social history were all reviewed and documented in the EPIC chart.   Exam:  Vitals:   09/03/21 0921  BP: (!) 88/58  Weight: 139 lb (63 kg)  Height: 5\' 3"  (1.6 m)   Body mass index is 24.62 kg/m.  General appearance:  Normal Thyroid:  Symmetrical, normal in size, without palpable masses or nodularity. Respiratory  Auscultation:  Clear without wheezing or rhonchi Cardiovascular  Auscultation:  Regular rate, without rubs, murmurs or gallops  Edema/varicosities:  Not grossly evident Abdominal  Soft,nontender, without masses, guarding or rebound.  Liver/spleen:  No organomegaly noted  Hernia:  None appreciated  Skin  Inspection:   Grossly normal Breasts: Examined lying and sitting.   Right: Without masses, retractions, nipple discharge or axillary adenopathy.   Left: Without masses, retractions, nipple discharge or axillary adenopathy. Genitourinary   Inguinal/mons:  Normal without inguinal adenopathy  External genitalia:  Normal appearing vulva with no masses, tenderness, or lesions  BUS/Urethra/Skene's glands:  Normal without masses or exudate  Vagina:  Normal appearing with normal color and discharge, no lesions  Cervix:  Normal appearing without discharge or lesions  Uterus:  Normal in size, shape and contour.  Mobile, nontender  Adnexa/parametria:     Rt: Normal in size, without masses or tenderness.   Lt: Normal in size, without masses or tenderness.  Anus and perineum: Normal   Patient informed chaperone available to be present for breast and pelvic exam. Patient has requested no chaperone to be present. Patient has been advised what will be completed during breast and pelvic exam.   Assessment/Plan:   1. Well woman exam with routine gynecological exam  - Cytology - PAP( [place])  2. Menorrhagia with regular cycle Offered hormonal management and declines at this time, will consider Lysteda would be an alternative to help with bleeding  3. Dysmenorrhea Ibuprofen 800mg  po TID prn    Discussed SBE, pap and STI screening as directed/appropriate. Recommend of exercise weekly, including weight bearing exercise. Encouraged the use of seatbelts and sunscreen. Return in 1 year for annual or as needed.   B WHNP-BC 9:50 AM 09/03/2021

## 2021-09-04 LAB — CYTOLOGY - PAP: Diagnosis: NEGATIVE

## 2021-09-06 LAB — QUANTIFERON-TB GOLD PLUS
Mitogen-NIL: >10 IU/mL
NIL: 0.04 IU/mL
QuantiFERON-TB Gold Plus: NEGATIVE
TB1-NIL: 0 IU/mL
TB2-NIL: 0 IU/mL

## 2022-09-01 ENCOUNTER — Encounter (HOSPITAL_BASED_OUTPATIENT_CLINIC_OR_DEPARTMENT_OTHER): Payer: Self-pay

## 2022-09-01 ENCOUNTER — Emergency Department (HOSPITAL_BASED_OUTPATIENT_CLINIC_OR_DEPARTMENT_OTHER)
Admission: EM | Admit: 2022-09-01 | Discharge: 2022-09-01 | Disposition: A | Discharge status: Home / Self Care | Payer: Self-pay | Attending: Emergency Medicine | Admitting: Emergency Medicine

## 2022-09-01 ENCOUNTER — Emergency Department (HOSPITAL_BASED_OUTPATIENT_CLINIC_OR_DEPARTMENT_OTHER): Payer: Self-pay | Admitting: Radiology

## 2022-09-01 ENCOUNTER — Other Ambulatory Visit: Payer: Self-pay

## 2022-09-01 DIAGNOSIS — R079 Chest pain, unspecified: Secondary | ICD-10-CM | POA: Insufficient documentation

## 2022-09-01 DIAGNOSIS — R0602 Shortness of breath: Secondary | ICD-10-CM | POA: Insufficient documentation

## 2022-09-01 LAB — CBC
HCT: 34.5 % — ABNORMAL LOW (ref 36.0–46.0)
Hemoglobin: 12 g/dL (ref 12.0–15.0)
MCH: 30.2 pg (ref 26.0–34.0)
MCHC: 34.8 g/dL (ref 30.0–36.0)
MCV: 86.9 fL (ref 80.0–100.0)
Platelets: 198 K/uL (ref 150–400)
RBC: 3.97 MIL/uL (ref 3.87–5.11)
RDW: 11.9 % (ref 11.5–15.5)
WBC: 4.9 K/uL (ref 4.0–10.5)
nRBC: 0 % (ref 0.0–0.2)

## 2022-09-01 LAB — BASIC METABOLIC PANEL
Anion gap: 7 (ref 5–15)
BUN: 20 mg/dL (ref 6–20)
CO2: 25 mmol/L (ref 22–32)
Calcium: 9.5 mg/dL (ref 8.9–10.3)
Chloride: 105 mmol/L (ref 98–111)
Creatinine, Ser: 0.77 mg/dL (ref 0.44–1.00)
GFR, Estimated: >60 mL/min (ref >60)
Glucose, Bld: 92 mg/dL (ref 70–99)
Potassium: 4 mmol/L (ref 3.5–5.1)
Sodium: 137 mmol/L (ref 135–145)

## 2022-09-01 LAB — TROPONIN I (HIGH SENSITIVITY): Troponin I (High Sensitivity): <2 ng/L

## 2022-09-01 NOTE — ED Notes (Signed)
All appropriate discharge materials reviewed at length with patient. Time for questions provided. Pt has no other questions at this time and verbalizes understanding of all provided materials.  

## 2022-09-01 NOTE — Discharge Instructions (Signed)
Evaluation today was overall reassuring.  Recommend follow-up with PCP.  If you have worsening chest pain, shortness of breath, calf tenderness, or any other concerning symptom please return emergency department further evaluation.

## 2022-09-01 NOTE — ED Provider Notes (Signed)
[place] EMERGENCY DEPARTMENT AT [facility] Provider Note   CSN: [id number] Arrival date & time: 09/01/22  1954     History  No chief complaint on file.  HPI Lindsey Sandoval is a 22 y.o. female presenting for chest pain.  States chest pain is been going on since beginning of July.  It is intermittent.  Today she felt chest pain returned.  Its midsternal.  It felt like an "elephant on my chest".  Endorsed shortness of breath at that time.  Was nonradiating and nonpleuritic.  Denies OCP use, calf tenderness, recent immobilization and known malignancy or coagulopathy.  States her symptoms completely resolved about 4 hours ago.  HPI     Home Medications Prior to Admission medications   Medication Sig Start Date End Date Taking? Authorizing Provider  Multiple Vitamins-Minerals (MULTIVITAMIN ADULTS PO) Take by mouth daily.    [provider]  ondansetron (ZOFRAN) 4 MG tablet Take 1 tablet (4 mg total) by mouth every 8 (eight) hours as needed for nausea or vomiting. Patient not taking: Reported on 09/03/2021 08/26/21   Karie Georges, MD  Vitamin D, Ergocalciferol, (DRISDOL) 1.25 MG (50000 UNIT) CAPS capsule Take 1 capsule (50,000 Units total) by mouth every 7 (seven) days. 08/10/21   Eulis Foster, FNP      Allergies    Almond (diagnostic) and Other    Review of Systems   See HPI for pertinent positives  Physical Exam Updated Vital Signs BP 116/74 (BP Location: Left Arm)   Pulse 76   Temp 98.5 F (36.9 C)   Resp 19   SpO2 98%  Physical Exam Vitals and nursing note reviewed.  HENT:     Head: Normocephalic and atraumatic.     Mouth/Throat:     Mouth: Mucous membranes are moist.  Eyes:     General:        Right eye: No discharge.        Left eye: No discharge.     Conjunctiva/sclera: Conjunctivae normal.  Cardiovascular:     Rate and Rhythm: Normal rate and regular rhythm.     Pulses: Normal pulses.     Heart sounds: Normal heart  sounds.  Pulmonary:     Effort: Pulmonary effort is normal.     Breath sounds: Normal breath sounds.  Abdominal:     General: Abdomen is flat.     Palpations: Abdomen is soft.  Skin:    General: Skin is warm and dry.  Neurological:     General: No focal deficit present.  Psychiatric:        Mood and Affect: Mood normal.     ED Results / Procedures / Treatments   Labs (all labs ordered are listed, but only abnormal results are displayed) Labs Reviewed  CBC - Abnormal; Notable for the following components:      Result Value   HCT 34.5 (*)    All other components within normal limits  BASIC METABOLIC PANEL  PREGNANCY, URINE  TROPONIN I (HIGH SENSITIVITY)    EKG None  Radiology DG Chest Port 1 View  Result Date: 09/01/2022 CLINICAL DATA:  Chest pain EXAM: PORTABLE CHEST 1 VIEW COMPARISON:  None Available. FINDINGS: The heart size and mediastinal contours are within normal limits. Both lungs are clear. The visualized skeletal structures are unremarkable. IMPRESSION: No active disease. Electronically Signed   By: Helyn Numbers M.D.   On: 09/01/2022 20:39    Procedures Procedures    Medications Ordered  in ED Medications - No data to display  ED Course/ Medical Decision Making/ A&P                                 Medical Decision Making Amount and/or Complexity of Data Reviewed Labs: ordered. Radiology: ordered.   22 year old well-appearing female presenting with chest pain.  Exam is unremarkable.  DDx includes PE, ACS, pneumonia, rib fracture or pneumothorax, CHF exacerbation or other.  Overall she remained clinically well, no acute distress and hemodynamically stable and remained asymptomatic.  Workup was overall reassuring.  Does not suggest PE or ACS.  Advised her to follow-up with her PCP.  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  Patient did endorse associated chest pain shortness of breath with anxiety in the past.  Discussed return precautions.  Vital stable.  Discharged  home in good condition.        Final Clinical Impression(s) / ED Diagnoses Final diagnoses:  Chest pain, unspecified type    Rx / DC Orders ED Discharge Orders     None         Gareth Eagle, PA-C 09/01/22 2057    Glyn Ade, MD 09/02/22 [phone]

## 2022-09-01 NOTE — ED Triage Notes (Signed)
C/o CP since beginning of July, "trying to give it the benefit of the doubt but it's been going on too long." States it can range from "pressure, to airy in my chest to elephant in my chest." Denies relevant cardiac/ resp hx

## 2023-01-15 ENCOUNTER — Other Ambulatory Visit: Payer: Self-pay

## 2023-01-15 ENCOUNTER — Encounter (HOSPITAL_BASED_OUTPATIENT_CLINIC_OR_DEPARTMENT_OTHER): Payer: Self-pay

## 2023-01-15 DIAGNOSIS — R0602 Shortness of breath: Secondary | ICD-10-CM | POA: Insufficient documentation

## 2023-01-15 DIAGNOSIS — R0789 Other chest pain: Secondary | ICD-10-CM | POA: Insufficient documentation

## 2023-01-15 LAB — CBC
HCT: 34.6 % — ABNORMAL LOW (ref 36.0–46.0)
Hemoglobin: 12 g/dL (ref 12.0–15.0)
MCH: 29.7 pg (ref 26.0–34.0)
MCHC: 34.7 g/dL (ref 30.0–36.0)
MCV: 85.6 fL (ref 80.0–100.0)
Platelets: 249 10*3/uL (ref 150–400)
RBC: 4.04 MIL/uL (ref 3.87–5.11)
RDW: 12.3 % (ref 11.5–15.5)
WBC: 5.7 10*3/uL (ref 4.0–10.5)
nRBC: 0 % (ref 0.0–0.2)

## 2023-01-15 LAB — BASIC METABOLIC PANEL
Anion gap: 7 (ref 5–15)
BUN: 15 mg/dL (ref 6–20)
CO2: 26 mmol/L (ref 22–32)
Calcium: 9.2 mg/dL (ref 8.9–10.3)
Chloride: 106 mmol/L (ref 98–111)
Creatinine, Ser: 0.87 mg/dL (ref 0.44–1.00)
GFR, Estimated: >60 mL/min (ref >60)
Glucose, Bld: 92 mg/dL (ref 70–99)
Potassium: 3.6 mmol/L (ref 3.5–5.1)
Sodium: 139 mmol/L (ref 135–145)

## 2023-01-15 LAB — TROPONIN I (HIGH SENSITIVITY): Troponin I (High Sensitivity): 2 ng/L (ref <18)

## 2023-01-15 NOTE — ED Triage Notes (Signed)
Pt c/o CP, SHOB, pain in L arm that crosses into back & R shoulder. Pt states "it happened last night & I didn't feel it again until tonight." Endorses similar episodes x mos, seen for same w neg eval. Pt denies NV, lightheaded episodes

## 2023-01-16 ENCOUNTER — Emergency Department (HOSPITAL_BASED_OUTPATIENT_CLINIC_OR_DEPARTMENT_OTHER)
Admission: EM | Admit: 2023-01-16 | Discharge: 2023-01-16 | Disposition: A | Discharge status: Home / Self Care | Payer: Self-pay | Attending: Emergency Medicine | Admitting: Emergency Medicine

## 2023-01-16 ENCOUNTER — Emergency Department (HOSPITAL_BASED_OUTPATIENT_CLINIC_OR_DEPARTMENT_OTHER): Payer: Self-pay

## 2023-01-16 DIAGNOSIS — R079 Chest pain, unspecified: Secondary | ICD-10-CM

## 2023-01-16 LAB — TROPONIN I (HIGH SENSITIVITY): Troponin I (High Sensitivity): 3 ng/L (ref <18)

## 2023-01-16 LAB — D-DIMER, QUANTITATIVE: D-Dimer, Quant: <0.27 ug{FEU}/mL (ref 0.00–0.50)

## 2023-01-16 NOTE — ED Provider Notes (Signed)
[place] EMERGENCY DEPARTMENT AT [facility] Provider Note   CSN: [id number] Arrival date & time: 01/15/23  2208     History  Chief complaint: Chest pain  Lindsey Sandoval is a 22 y.o. female.  The history is provided by the patient.  She has no significant past history and comes in complaining of episodes of left-sided chest pain with associated shortness of breath.  These episodes have been coming over about the last 6 months and can last up to 1 hour.  She describes a heavy feeling or pressure feeling in the left side of her chest.  There is no associated nausea, vomiting, diaphoresis.  There is no associated cough.  Pain is not pleuritic.  It comes on randomly and resolves spontaneously.  He is a non-smoker and denies history of hypertension or diabetes or hyperlipidemia and there is no family history of premature coronary atherosclerosis.  She denies travel, use of exogenous estrogens, history of DVT.   Home Medications Prior to Admission medications   Medication Sig Start Date End Date Taking? Authorizing Provider  Multiple Vitamins-Minerals (MULTIVITAMIN ADULTS PO) Take by mouth daily.    [provider]  ondansetron (ZOFRAN) 4 MG tablet Take 1 tablet (4 mg total) by mouth every 8 (eight) hours as needed for nausea or vomiting. Patient not taking: Reported on 09/03/2021 08/26/21   Karie Georges, MD  Vitamin D, Ergocalciferol, (DRISDOL) 1.25 MG (50000 UNIT) CAPS capsule Take 1 capsule (50,000 Units total) by mouth every 7 (seven) days. 08/10/21   Eulis Foster, FNP      Allergies    Almond (diagnostic) and Other    Review of Systems   Review of Systems  All other systems reviewed and are negative.   Physical Exam Updated Vital Signs BP 107/79 (BP Location: Right Arm)   Pulse 68   Temp 97.8 F (36.6 C) (Oral)   Resp 18   LMP 01/15/2023 (Exact Date)   SpO2 100%  Physical Exam Vitals and nursing note reviewed.   22 year old  female, resting comfortably and in no acute distress. Vital signs are normal. Oxygen saturation is 100%, which is normal. Head is normocephalic and atraumatic. PERRLA, EOMI. Oropharynx is clear. Neck is nontender and supple without adenopathy or JVD. Back is nontender and there is no CVA tenderness. Lungs are clear without rales, wheezes, or rhonchi. Chest is nontender. Heart has regular rate and rhythm without murmur. Abdomen is soft, flat, nontender. Extremities have no cyanosis or edema, full range of motion is present. Skin is warm and dry without rash. Neurologic: Mental status is normal, moves all extremities equally.  ED Results / Procedures / Treatments   Labs (all labs ordered are listed, but only abnormal results are displayed) Labs Reviewed  CBC - Abnormal; Notable for the following components:      Result Value   HCT 34.6 (*)    All other components within normal limits  BASIC METABOLIC PANEL  D-DIMER, QUANTITATIVE  PREGNANCY, URINE  TROPONIN I (HIGH SENSITIVITY)  TROPONIN I (HIGH SENSITIVITY)    EKG EKG Interpretation Date/Time:  Friday January 15 2023 22:17:06 EST Ventricular Rate:  68 PR Interval:  138 QRS Duration:  86 QT Interval:  382 QTC Calculation: 406 R Axis:   74  Text Interpretation: Normal sinus rhythm Normal ECG When compared with ECG of 01-Sep-2022 20:26, No significant change was found Confirmed by Dione Booze ([id number]) on 01/16/2023 3:01:02 AM  Radiology DG Chest [facility] [address]  Result Date: 01/16/2023 CLINICAL DATA:  Chest pain EXAM: PORTABLE CHEST 1 VIEW COMPARISON:  09/01/2022 FINDINGS: The heart size and mediastinal contours are within normal limits. Both lungs are clear. The visualized skeletal structures are unremarkable. IMPRESSION: No active disease. Electronically Signed   By: Burman Nieves M.D.   On: 01/16/2023 00:43    Procedures Procedures    Medications Ordered in ED Medications - No data to display  ED Course/ Medical  Decision Making/ A&P             HEART Score: 0                    Medical Decision Making Amount and/or Complexity of Data Reviewed Labs: ordered.   Episodic chest discomfort of unclear etiology.  Doubt ACS, pulmonary embolism, pneumonia, pleurisy.  I have reviewed her electrocardiogram, and my interpretation is normal ECG and unchanged from prior.  Chest x-ray is normal.  Have independently viewed the images, and agree with radiologist's interpretation.  I have reviewed her laboratory tests, my interpretation is normal basic metabolic panel, normal troponin x 2, normal CBC.  I have reviewed her past records and note ED visit on 09/01/2022 for similar symptoms and with negative workup.  Heart score 0, which puts her at low risk for major adverse cardiac events in the next 6 weeks.  Although she has no risk factors for pulmonary embolism, with persistent unexplained symptoms, I feel it is important to rule it out I have ordered a D-dimer..  Patient stated that she had to leave before D-dimer had resulted.  I did not feel like it discharge her at this point so she left AGAINST MEDICAL ADVICE.  D-dimer has come back normal, effectively ruling out pulmonary embolism.  No evidence of any serious pathology causing her pain.  Final Clinical Impression(s) / ED Diagnoses Final diagnoses:  Nonspecific chest pain    Rx / DC Orders ED Discharge Orders     None         Dione Booze, MD 01/16/23 [phone]

## 2023-05-06 ENCOUNTER — Ambulatory Visit: Payer: Self-pay

## 2023-05-06 NOTE — Telephone Encounter (Signed)
 Chief Complaint: chest pain Symptoms: chest pain, R-sided shoulder/back pain. SOB w/ chest pain, heart palpitations, headaches Frequency: since last summer Pertinent Negatives: Patient denies dizziness, N/V, diaphoresis, fever, cough Disposition: [x] ED /[] Urgent Care (no appt availability in office) / [] Appointment(In office/virtual)/ []  [facility]/ [] Home Care/ [] Refused Recommended Disposition /[] [facility]/ []  Follow-up with PCP Additional Notes: Pt has a physical scheduled for 4/29 but has acute symptoms at this time. Pt reports ongoing, intermittent chest pain since last summer. Pt was seen in the ED for chest pain last August and last December. Pt reports intermittent chest pain that can last seconds or hours. When it is 10/10 pain, it lasts seconds. Sometimes the chest pain is 5/10 and lasts hours. Pt reports SOB with chest pain. Pt also reports constant 6/10 pain in R-sided back/shoulder area. Pt states one time her chest pain was so bad she "couldn't even move." RN advised pt she should go to the ED to rule out a life-threatening cause for her symptoms and that the results of a cardiac work-up have not changed since December. Pt  verbalized understanding. 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 Pt states she can't go to the ED right now but she will try to go later after work. RN advised pt if she gets worse, she needs to call 911. Pt verbalized understanding.    Reason for Disposition  [1] Chest pain lasts > 5 minutes AND [2] occurred in past 3 days (72 hours) (Exception: Feels exactly the same as previously diagnosed heartburn and has accompanying sour taste in mouth.)  Answer Assessment - Initial Assessment Questions 1. LOCATION: "Where does it hurt?"       R-sided back and shoulder pain is consistent even after a massage. Chest pain ranges from the L and middle, that is intermittent 2. RADIATION: "Does the pain go anywhere else?" (e.g.,  into neck, jaw, arms, back)     R shoulder/back 3. ONSET: "When did the chest pain begin?" (Minutes, hours or days)      Started summer of last year. Went to the hospital in August. Went back in December.  4. PATTERN: "Does the pain come and go, or has it been constant since it started?"  "Does it get worse with exertion?"      Comes and goes - right now she doesn't feel anything but yesterday she does 5. DURATION: "How long does it last" (e.g., seconds, minutes, hours)     R shoulder pain is constant, chest pain "can go for hours, it ranges, when it is really painful it lasted 5 seconds" 6. SEVERITY: "How bad is the pain?"  (e.g., Scale 1-10; mild, moderate, or severe)    - MILD (1-3): doesn't interfere with normal activities     - MODERATE (4-7): interferes with normal activities or awakens from sleep    - SEVERE (8-10): excruciating pain, unable to do any normal activities       10/10 "at the peak of it", "one time I couldn't even move", ranges from a 5-10. For R shoulder/back pain, pt rates that a 6/10. 7. CARDIAC RISK FACTORS: "Do you have any history of heart problems or risk factors for heart disease?" (e.g., angina, prior heart attack; diabetes, high blood pressure, high cholesterol, smoker, or strong family history of heart disease)     None - pt does state HTN runs in her family on both sides 8. PULMONARY RISK FACTORS: "Do you have any history  of lung disease?"  (e.g., blood clots in lung, asthma, emphysema, birth control pills)     None 9. CAUSE: "What do you think is causing the chest pain?"     Not sure  10. OTHER SYMPTOMS: "Do you have any other symptoms?" (e.g., dizziness, nausea, vomiting, sweating, fever, difficulty breathing, cough)       "A little SOB" with the chest pain. "I definitely get heart palpitations, I get that often", I also get headaches.  11. PREGNANCY: "Is there any chance you are pregnant?" "When was your last menstrual period?"       Period was last month,  next one should come on the 20th. Not pregnant as far as she knows  Protocols used: Chest Pain-A-AH

## 2023-05-06 NOTE — Telephone Encounter (Signed)
 Noted- ok to close.

## 2023-05-18 ENCOUNTER — Ambulatory Visit (INDEPENDENT_AMBULATORY_CARE_PROVIDER_SITE_OTHER): Payer: Self-pay | Admitting: Family Medicine

## 2023-05-18 ENCOUNTER — Encounter: Payer: Self-pay | Admitting: Family Medicine

## 2023-05-18 VITALS — BP 100/74 | HR 65 | Temp 98.5°F | Ht 64.0 in | Wt 188.1 lb

## 2023-05-18 DIAGNOSIS — R0789 Other chest pain: Secondary | ICD-10-CM

## 2023-05-18 NOTE — Progress Notes (Signed)
 Established Patient Office Visit  Subjective   Patient ID: Lindsey Sandoval, female    DOB: [birth year]  Age: 23 y.o. MRN: [id number]  Chief Complaint  Patient presents with   Annual Exam    Pt was here for annual physical however she has new complaints so the visit was switched.   Today pt is reporting chest pain she has been having intermittently for hte last year. States that the pain is sometimes on the left side, sometimes moves to the center, sometimes radiates into her back. States that she will occasionally feel short of breath with the pain. Usually tried to breathe through it. Describes the pain as very sharp. Pt states the pain is random, not always associated with physical activity, can work out or go running without the pain. Not associated with meals, sometimes will last around 10 minutes and sometimes it will last the whole day. Sometimes she will go to sleep with the pain and it will still be there, no nausea but does sometimes get headaches. States that sometimes she will feel "tingling" in her head with the chest pain sometimes. Patient also describes having heart palpitations with the chest pain episodes.   Hasn't really tried to take anything like tylenol or antacids to try and help with the pain. I reviewed the ED notes and EKG's, labs, troponin, etc.   Of note she did see the rheumatologist and was told "everything was fine" and they did not find anything to diagnose her with.      Current Outpatient Medications  Medication Instructions   Multiple Vitamins-Minerals (MULTIVITAMIN ADULTS PO) Daily    Patient Active Problem List   Diagnosis Date Noted   Chronic constipation 08/26/2021   Nausea & vomiting 08/26/2021   Menstrual cramps 08/26/2021   Keloid 09/15/2017      Review of Systems  All other systems reviewed and are negative.     Objective:     BP 100/74   Pulse 65   Temp 98.5 F (36.9 C) (Oral)   Ht 5\' 4"  (1.626 m)   Wt 188  lb 1.6 oz (85.3 kg)   LMP 05/16/2023 (Exact Date)   SpO2 99%   BMI 32.29 kg/m    Physical Exam Vitals reviewed.  Constitutional:      Appearance: Normal appearance. She is well-groomed and normal weight.  Eyes:     Conjunctiva/sclera: Conjunctivae normal.  Cardiovascular:     Rate and Rhythm: Normal rate and regular rhythm.     Pulses: Normal pulses.     Heart sounds: S1 normal and S2 normal.  Pulmonary:     Effort: Pulmonary effort is normal.     Breath sounds: Normal breath sounds and air entry.  Musculoskeletal:     Right lower leg: No edema.     Left lower leg: No edema.  Neurological:     Mental Status: She is alert and oriented to person, place, and time. Mental status is at baseline.     Gait: Gait is intact.  Psychiatric:        Mood and Affect: Mood and affect normal.        Speech: Speech normal.        Behavior: Behavior normal.        Judgment: Judgment normal.      No results found for any visits on 05/18/23.    The ASCVD Risk score (Arnett DK, et al., 2019) failed to calculate for the following reasons:   The  2019 ASCVD risk score is only valid for ages 109 to 36    Assessment & Plan:  Atypical chest pain -     Ambulatory referral to Cardiology   Normal physical exam findings. I spent 20 minutes with the patient today counseling her on possible causes of her chest pain, reviewing ED notes, labs, EKGs and CXR. Will send to cardiology for additional work up   Return for follow up after you see the cardiologist. .    Aida House, MD

## 2023-05-18 NOTE — Patient Instructions (Addendum)
 Pepcid 20 mg twice a day as needed  Keep a journal of the chest pain events   [email or web address]

## 2023-05-25 ENCOUNTER — Encounter: Payer: Self-pay | Admitting: Family Medicine

## 2023-08-25 ENCOUNTER — Other Ambulatory Visit: Payer: Self-pay

## 2023-08-25 ENCOUNTER — Emergency Department (HOSPITAL_BASED_OUTPATIENT_CLINIC_OR_DEPARTMENT_OTHER)
Admission: EM | Admit: 2023-08-25 | Discharge: 2023-08-25 | Disposition: A | Discharge status: Home / Self Care | Payer: Self-pay

## 2023-08-25 DIAGNOSIS — M79602 Pain in left arm: Secondary | ICD-10-CM | POA: Insufficient documentation

## 2023-08-25 DIAGNOSIS — M542 Cervicalgia: Secondary | ICD-10-CM | POA: Insufficient documentation

## 2023-08-25 DIAGNOSIS — M79609 Pain in unspecified limb: Secondary | ICD-10-CM

## 2023-08-25 MED ORDER — METHOCARBAMOL 750 MG PO TABS: 750.0000 mg | ORAL_TABLET | Freq: Three times a day (TID) | ORAL | 0 refills | Status: AC

## 2023-08-25 MED ORDER — IBUPROFEN 600 MG PO TABS
600.0000 mg | ORAL_TABLET | Freq: Four times a day (QID) | ORAL | 0 refills | Status: AC | PRN
Start: 2023-08-25 — End: ?

## 2023-08-25 NOTE — ED Triage Notes (Addendum)
 X1 week left arm fatigue and neck soreness, headaches-intermittently. Denies trauma. No focal deficits appreciated in triage- NIH0.

## 2023-08-25 NOTE — Discharge Instructions (Signed)
 Your EKG is normal and without any sign of ischemia. As we discussed, the arm pain is likely unrelated to the ongoing chest pain pending evaluation by cardiology in September. Take the medications as prescribed and schedule a recheck appointment with your doctor for one week.   Return to the ED with any new or concerning symptoms.

## 2023-08-25 NOTE — ED Provider Notes (Signed)
 [place] EMERGENCY DEPARTMENT AT [facility] Provider Note   CSN: [id number] Arrival date & time: 08/25/23  2021     Patient presents with: Fatigue   Lindsey Sandoval is a 23 y.o. female.   Patient to ED for evaluation of pain in the left side neck that extends down the left arm circumferentially starting one week ago. No injury. No midline cervical pain. No numbness. She feels weaker on the left UE than right, but denies dropping objects she is holding. No SOB, cough or fever. She reports pending appointment with cardiology next month for evaluation of ongoing intermittent chest pain. She denies chest pain tonight.   The history is provided by the patient. No language interpreter was used.       Prior to Admission medications   Medication Sig Start Date End Date Taking? Authorizing Provider  ibuprofen  (ADVIL ) 600 MG tablet Take 1 tablet (600 mg total) by mouth every 6 (six) hours as needed. 08/25/23  Yes Yamna Mackel, Margit, PA-C  methocarbamol  (ROBAXIN ) 750 MG tablet Take 1 tablet (750 mg total) by mouth 3 (three) times daily. 08/25/23  Yes Zamariya Neal, Margit, PA-C  Multiple Vitamins-Minerals (MULTIVITAMIN ADULTS PO) Take by mouth daily.    [provider]    Allergies: Almond (diagnostic) and Other    Review of Systems  Updated Vital Signs BP 128/76   Pulse 62   Temp 99 F (37.2 C) (Oral)   Resp 18   SpO2 100%   Physical Exam Vitals and nursing note reviewed.  Constitutional:      Appearance: She is well-developed.  HENT:     Head: Normocephalic.  Neck:     Comments: No midline cervical tenderness. No reproducible left lateral neck tenderness. No swelling about the neck.  Cardiovascular:     Rate and Rhythm: Normal rate and regular rhythm.     Heart sounds: No murmur heard. Pulmonary:     Effort: Pulmonary effort is normal.     Breath sounds: Normal breath sounds. No wheezing, rhonchi or rales.  Chest:     Chest wall: No  tenderness.  Abdominal:     General: Bowel sounds are normal.     Palpations: Abdomen is soft.     Tenderness: There is no abdominal tenderness. There is no guarding or rebound.  Musculoskeletal:        General: Normal range of motion.     Cervical back: Normal range of motion and neck supple.     Comments: FROM bilateral upper extremities. No strength deficits in UE's.   Skin:    General: Skin is warm and dry.  Neurological:     General: No focal deficit present.     Mental Status: She is alert and oriented to person, place, and time.     Sensory: No sensory deficit.     (all labs ordered are listed, but only abnormal results are displayed) Labs Reviewed - No data to display  EKG: None  Radiology: No results found.   Procedures   Medications Ordered in the ED - No data to display  Clinical Course as of 08/25/23 2326  Wed Aug 25, 2023  2259 Patient to ED with 1 week of left arm and neck soreness. Feels weak but not dropping items. No sensory changes. She is more concerned that current symptoms are associated with intermittent sharp, brief chest pain she has had ongoing and is scheduled to see cardiology for in September. Moreover, she is concerned the current symptoms  may represent worsening heart pain. EKG obtained to ensure to acute changes. Neck and arm pain felt MSK in nature, no neuro deficits.  [SU]  2323 EKG nonacute. Patient reassured. Will treat with ibuprofen  600 mg and Robaxin  1000 mg. PCP follow up.  [SU]    Clinical Course User Index [SU] Odell Balls, PA-C                                 Medical Decision Making       Final diagnoses:  Left arm pain  Musculoskeletal pain of extremity    ED Discharge Orders          Ordered    methocarbamol  (ROBAXIN ) 750 MG tablet  3 times daily        08/25/23 2324    ibuprofen  (ADVIL ) 600 MG tablet  Every 6 hours PRN        08/25/23 2324               Odell Balls, PA-C 08/25/23 2326     Kammerer, Megan L, DO 08/26/23 1919

## 2023-09-30 NOTE — Progress Notes (Unsigned)
 Cardiology Office Note:    Date:  10/01/2023   ID:  Lindsey Sandoval, DOB Nov 28, 2000, MRN [id number]  PCP:  Ozell Heron HERO, MD  Cardiologist:  None  Electrophysiologist:  None   Referring MD: Ozell Heron HERO, MD   Chief Complaint  Patient presents with   Chest Pain    History of Present Illness:    Lindsey Sandoval is a 23 y.o. female with a hx of exercise-induced asthma who was referred by Dr. Ozell for evaluation of chest pain.  She reports she has been having chest pain, describes as being in left/center of chest, describes sometimes feeling a stabbing pain but also sometimes heaviness.  Can last for hours.  Has not noted relationship with exertion.  Feels short of breath when she is having episodes.  Reports has been having headaches but denies any syncopal episodes.  She denies any lower extremity edema.  Reports palpitations lasting for few seconds, none recently.  No smoking history.  No family history of heart disease.  For exercise she will run for 1 to 2 miles.   Past Medical History:  Diagnosis Date   Exercise-induced asthma    Vitamin D  deficiency     Past Surgical History:  Procedure Laterality Date   NO PAST SURGERIES      Current Medications: Current Meds  Medication Sig   ibuprofen  (ADVIL ) 600 MG tablet Take 1 tablet (600 mg total) by mouth every 6 (six) hours as needed.   Multiple Vitamins-Minerals (MULTIVITAMIN ADULTS PO) Take by mouth daily.     Allergies:   Almond (diagnostic) and Other   Social History   Socioeconomic History   Marital status: Single    Spouse name: Not on file   Number of children: Not on file   Years of education: Not on file   Highest education level: Not on file  Occupational History   Not on file  Tobacco Use   Smoking status: Never   Smokeless tobacco: Never  Substance and Sexual Activity   Alcohol use: Never   Drug use: Never   Sexual activity: Never    Partners: Male   Other Topics Concern   Not on file  Social History Narrative   Not on file   Social Drivers of Health   Financial Resource Strain: Not on file  Food Insecurity: Not on file  Transportation Needs: Not on file  Physical Activity: Not on file  Stress: Not on file  Social Connections: Not on file     Family History: The patient's family history includes Alzheimer's disease (age of onset: 56) in her maternal grandmother; Dementia (age of onset: 19) in her maternal aunt; Dementia (age of onset: 43) in her maternal aunt; Diabetes in her father; Edema in her paternal grandmother; Hypertension in her mother.  ROS:   Please see the history of present illness.     All other systems reviewed and are negative.  EKGs/Labs/Other Studies Reviewed:    The following studies were reviewed today:   EKG:   Normal sinus rhythm, rate 69, no ST abnormalities  Recent Labs: 01/15/2023: BUN 15; Creatinine, Ser 0.87; Hemoglobin 12.0; Platelets 249; Potassium 3.6; Sodium 139  Recent Lipid Panel    Component Value Date/Time   CHOL 126 09/11/2019 0741   TRIG 27 09/11/2019 0741   HDL 55 09/11/2019 0741   CHOLHDL 2.3 09/11/2019 0741   VLDL 16.0 08/22/2018 1438   LDLCALC 63 09/11/2019 0741    Physical Exam:  VS:  BP 100/62 (BP Location: Left Arm, Patient Position: Sitting, Cuff Size: Normal)   Pulse 69   Ht 5' 3 (1.6 m)   Wt 187 lb 6.4 oz (85 kg)   SpO2 98%   BMI 33.20 kg/m     Wt Readings from Last 3 Encounters:  10/01/23 187 lb 6.4 oz (85 kg)  05/18/23 188 lb 1.6 oz (85.3 kg)  09/03/21 139 lb (63 kg)     GEN:  Well nourished, well developed in no acute distress HEENT: Normal NECK: No JVD; No carotid bruits LYMPHATICS: No lymphadenopathy CARDIAC: RRR, no murmurs, rubs, gallops RESPIRATORY:  Clear to auscultation without rales, wheezing or rhonchi  ABDOMEN: Soft, non-tender, non-distended MUSCULOSKELETAL:  No edema; No deformity  SKIN: Warm and dry NEUROLOGIC:  Alert and  oriented x 3 PSYCHIATRIC:  Normal affect   ASSESSMENT:    1. Chest pain of uncertain etiology    PLAN:    Chest pain: Atypical in description.  Given age and lack of risk factors, low concern for ischemic heart disease.  EKG is interpretable and she is able to exercise.  Plan ETT for further evaluation.  RTC as needed  Informed Consent   Shared Decision Making/Informed Consent The risks [chest pain, shortness of breath, cardiac arrhythmias, dizziness, blood pressure fluctuations, myocardial infarction, stroke/transient ischemic attack, and life-threatening complications (estimated to be 1 in 10,000)], benefits (risk stratification, diagnosing coronary artery disease, treatment guidance) and alternatives of an exercise tolerance test were discussed in detail with Ms. Sandoval and she agrees to proceed.      Medication Adjustments/Labs and Tests Ordered: Current medicines are reviewed at length with the patient today.  Concerns regarding medicines are outlined above.  Orders Placed This Encounter  Procedures   EXERCISE TOLERANCE TEST (ETT)   EKG 12-Lead   No orders of the defined types were placed in this encounter.   Patient Instructions  Medication Instructions:  Continue current medications *If you need a refill on your cardiac medications before your next appointment, please call your pharmacy*  Lab Work: none If you have labs (blood work) drawn today and your tests are completely normal, you will receive your results only by: MyChart Message (if you have MyChart) OR A paper copy in the mail If you have any lab test that is abnormal or we need to change your treatment, we will call you to review the results.  Testing/Procedures: Your physician has requested that you have an Exercise Stress Test. An exercise tolerance test is a test to check how your heart works during exercise. You will need to walk on a treadmill for this test. An electrocardiogram (ECG) will record  your heartbeat when you are at rest and when you are exercising.    Please arrive 15 minutes prior to your appointment time for registration and insurance purposes.   The test will take approximately 45 minutes to complete.   How to prepare for your Exercise Stress Test: Do bring a list of your current medications with you.  If not listed below, you may take your medications as normal. Do wear comfortable clothes (no dresses or overalls) and walking shoes, tennis shoes preferred (no heels or open toed shoes are allowed) Do Not wear cologne, perfume, aftershave or lotions (deodorant is allowed). Do not drink or eat foods with caffeine for 24 hours before the test. (Chocolate, coffee, tea, or energy drinks) If you use an inhaler, bring it with you to the test. Do not smoke for 4  hours before the test.    If these instructions are not followed, your test will have to be rescheduled.   If you cannot keep your appointment, please provide 24 hours notification to our office, to avoid a possible $50 charge to your account.     Follow-Up: At [facility], you and your health needs are our priority.  As part of our continuing mission to provide you with exceptional heart care, our providers are all part of one team.  This team includes your primary Cardiologist (physician) and Advanced Practice Providers or APPs (Physician Assistants and Nurse Practitioners) who all work together to provide you with the care you need, when you need it.  Your next appointment:   As needed  Provider:   Dr. Kate  We recommend signing up for the patient portal called MyChart.  Sign up information is provided on this After Visit Summary.  MyChart is used to connect with patients for Virtual Visits (Telemedicine).  Patients are able to view lab/test results, encounter notes, upcoming appointments, etc.  Non-urgent messages can be sent to your provider as well.   To learn more about what you can do with  MyChart, go to [email or web address].   Other Instructions none       Signed, Lonni LITTIE Kate, MD  10/01/2023 2:56 PM    [facility]

## 2023-10-01 ENCOUNTER — Ambulatory Visit: Payer: Self-pay | Attending: Cardiology | Admitting: Cardiology

## 2023-10-01 ENCOUNTER — Encounter: Payer: Self-pay | Admitting: Cardiology

## 2023-10-01 VITALS — BP 100/62 | HR 69 | Ht 63.0 in | Wt 187.4 lb

## 2023-10-01 DIAGNOSIS — R079 Chest pain, unspecified: Secondary | ICD-10-CM

## 2023-10-01 NOTE — Patient Instructions (Signed)
 Medication Instructions:  Continue current medications *If you need a refill on your cardiac medications before your next appointment, please call your pharmacy*  Lab Work: none If you have labs (blood work) drawn today and your tests are completely normal, you will receive your results only by: MyChart Message (if you have MyChart) OR A paper copy in the mail If you have any lab test that is abnormal or we need to change your treatment, we will call you to review the results.  Testing/Procedures: Your physician has requested that you have an Exercise Stress Test. An exercise tolerance test is a test to check how your heart works during exercise. You will need to walk on a treadmill for this test. An electrocardiogram (ECG) will record your heartbeat when you are at rest and when you are exercising.    Please arrive 15 minutes prior to your appointment time for registration and insurance purposes.   The test will take approximately 45 minutes to complete.   How to prepare for your Exercise Stress Test: Do bring a list of your current medications with you.  If not listed below, you may take your medications as normal. Do wear comfortable clothes (no dresses or overalls) and walking shoes, tennis shoes preferred (no heels or open toed shoes are allowed) Do Not wear cologne, perfume, aftershave or lotions (deodorant is allowed). Do not drink or eat foods with caffeine for 24 hours before the test. (Chocolate, coffee, tea, or energy drinks) If you use an inhaler, bring it with you to the test. Do not smoke for 4 hours before the test.    If these instructions are not followed, your test will have to be rescheduled.   If you cannot keep your appointment, please provide 24 hours notification to our office, to avoid a possible $50 charge to your account.     Follow-Up: At [facility], you and your health needs are our priority.  As part of our continuing mission to provide you  with exceptional heart care, our providers are all part of one team.  This team includes your primary Cardiologist (physician) and Advanced Practice Providers or APPs (Physician Assistants and Nurse Practitioners) who all work together to provide you with the care you need, when you need it.  Your next appointment:   As needed  Provider:   Dr. Kate  We recommend signing up for the patient portal called MyChart.  Sign up information is provided on this After Visit Summary.  MyChart is used to connect with patients for Virtual Visits (Telemedicine).  Patients are able to view lab/test results, encounter notes, upcoming appointments, etc.  Non-urgent messages can be sent to your provider as well.   To learn more about what you can do with MyChart, go to [email or web address].   Other Instructions none

## 2023-10-12 ENCOUNTER — Encounter (HOSPITAL_COMMUNITY): Payer: Self-pay | Admitting: *Deleted

## 2023-10-20 ENCOUNTER — Encounter: Payer: Self-pay | Admitting: Family Medicine

## 2023-10-25 ENCOUNTER — Ambulatory Visit (HOSPITAL_COMMUNITY): Payer: Self-pay

## 2023-11-05 ENCOUNTER — Encounter (HOSPITAL_COMMUNITY): Payer: Self-pay | Admitting: *Deleted

## 2023-11-25 ENCOUNTER — Ambulatory Visit (HOSPITAL_COMMUNITY)
Admission: RE | Admit: 2023-11-25 | Discharge: 2023-11-25 | Disposition: A | Discharge status: Home / Self Care | Payer: Self-pay | Source: Ambulatory Visit | Attending: Internal Medicine | Admitting: Internal Medicine

## 2023-11-25 DIAGNOSIS — R079 Chest pain, unspecified: Secondary | ICD-10-CM | POA: Insufficient documentation

## 2023-11-25 LAB — EXERCISE TOLERANCE TEST
Angina Index: 0
Base ST Depression (mm): 0 mm
Duke Treadmill Score: 8
Estimated workload: 9.6
Exercise duration (min): 7 min
Exercise duration (sec): 45 s
MPHR: 196 {beats}/min
Peak HR: 173 {beats}/min
Percent HR: 88 %
Rest HR: 86 {beats}/min
ST Depression (mm): 0 mm

## 2023-11-28 ENCOUNTER — Ambulatory Visit: Payer: Self-pay | Admitting: Cardiology
